# Patient Record
Sex: Female | Born: 1975 | Race: Black or African American | Hispanic: No | Marital: Single | Smoking: Former smoker
Health system: Southern US, Community
[De-identification: ages and names within clinical notes are randomized; demographics above are authoritative.]

## PROBLEM LIST (undated history)

## (undated) HISTORY — PX: CARPAL TUNNEL RELEASE: SHX101

---

## 2019-08-14 ENCOUNTER — Other Ambulatory Visit: Payer: Self-pay

## 2019-08-14 ENCOUNTER — Encounter (HOSPITAL_COMMUNITY): Payer: Self-pay | Admitting: *Deleted

## 2019-08-14 ENCOUNTER — Ambulatory Visit (HOSPITAL_COMMUNITY)
Admission: EM | Admit: 2019-08-14 | Discharge: 2019-08-14 | Disposition: A | Discharge status: Home / Self Care | Payer: Self-pay | Attending: Emergency Medicine | Admitting: Emergency Medicine

## 2019-08-14 DIAGNOSIS — Z3202 Encounter for pregnancy test, result negative: Secondary | ICD-10-CM

## 2019-08-14 DIAGNOSIS — M436 Torticollis: Secondary | ICD-10-CM

## 2019-08-14 LAB — POCT PREGNANCY, URINE: Preg Test, Ur: NEGATIVE

## 2019-08-14 MED ORDER — KETOROLAC TROMETHAMINE 30 MG/ML IJ SOLN
30.0000 mg | Freq: Once | INTRAMUSCULAR | Status: AC
  Administered 2019-08-14: 30 mg via INTRAMUSCULAR

## 2019-08-14 MED ORDER — ACETAMINOPHEN 325 MG PO TABS
ORAL_TABLET | ORAL | Status: AC
  Filled 2019-08-14: qty 3

## 2019-08-14 MED ORDER — TIZANIDINE HCL 4 MG PO TABS: 4.0000 mg | ORAL_TABLET | Freq: Three times a day (TID) | ORAL | 0 refills | Status: AC | PRN

## 2019-08-14 MED ORDER — KETOROLAC TROMETHAMINE 30 MG/ML IJ SOLN
INTRAMUSCULAR | Status: AC
  Filled 2019-08-14: qty 1

## 2019-08-14 MED ORDER — ACETAMINOPHEN 325 MG PO TABS
975.0000 mg | ORAL_TABLET | Freq: Once | ORAL | Status: AC
  Administered 2019-08-14: 15:00:00 975 mg via ORAL

## 2019-08-14 MED ORDER — DIAZEPAM 5 MG PO TABS: 5.0000 mg | ORAL_TABLET | Freq: Three times a day (TID) | ORAL | 0 refills | Status: DC | PRN

## 2019-08-14 MED ORDER — IBUPROFEN 600 MG PO TABS: 600.0000 mg | ORAL_TABLET | Freq: Four times a day (QID) | ORAL | 0 refills | Status: AC | PRN

## 2019-08-14 NOTE — ED Provider Notes (Signed)
HPI  SUBJECTIVE:  Rhonda Randolph is a 43 y.o. female who presents with 6 days of constant, sharp, stabbing left-sided neck pain after sleeping on it wrong.  She denies fall, trauma, fever, sore throat, voice changes, sensation of airway swelling/effusion.   States that her left arm is okay.  She reports limitation of motion of her neck secondary to pain.  No numbness or tingling in her left arm.  She tried Excedrin, ibuprofen 800 mg, icy patches without improvement in her symptoms.  Symptoms worse with turning her head to the left and bending forward.  Past medical history negative for neck injury, diabetes, hypertension.  LMP: 1 year ago.  PMD: None.  History reviewed. No pertinent past medical history.  Past Surgical History:  Procedure Laterality Date  . CARPAL TUNNEL RELEASE    . CESAREAN SECTION      Family History  Problem Relation Age of Onset  . Diabetes Mother   . Hypertension Mother   . Diabetes Father     Social History   Tobacco Use  . Smoking status: Former Research scientist (life sciences)  . Smokeless tobacco: Never Used  Substance Use Topics  . Alcohol use: Yes    Comment: occasionally  . Drug use: Never     Current Facility-Administered Medications:  .  acetaminophen (TYLENOL) tablet 975 mg, 975 mg, Oral, Once, Melynda Ripple, MD .  ketorolac (TORADOL) 30 MG/ML injection 30 mg, 30 mg, Intramuscular, Once, Melynda Ripple, MD  Current Outpatient Medications:  .  diazepam (VALIUM) 5 MG tablet, Take 1 tablet (5 mg total) by mouth every 8 (eight) hours as needed for muscle spasms., Disp: 12 tablet, Rfl: 0 .  ibuprofen (ADVIL) 600 MG tablet, Take 1 tablet (600 mg total) by mouth every 6 (six) hours as needed., Disp: 30 tablet, Rfl: 0 .  tiZANidine (ZANAFLEX) 4 MG tablet, Take 1 tablet (4 mg total) by mouth every 8 (eight) hours as needed for muscle spasms., Disp: 30 tablet, Rfl: 0  No Known Allergies   ROS  As noted in HPI.   Physical Exam  BP 120/83   Pulse 77   Temp 98 F  (36.7 C) (Other (Comment))   Resp 18   SpO2 99%   Constitutional: Well developed, well nourished, no acute distress Eyes:  EOMI, conjunctiva normal bilaterally HENT: Normocephalic, atraumatic,mucus membranes moist. Normal voice. Respiratory: Normal inspiratory effort Cardiovascular: Normal rate, regular rhythm, no murmurs rubs or gallops.  No carotid bruit GI: nondistended skin: No rash, skin intact Musculoskeletal: No C-spine tenderness.  Patient unable to fully extend neck or turn head to the left.  Able to flex neck, turn head to the right.  Positive left-sided trapezial tenderness, muscle spasm.  Positive tenderness along the splenis capitis.  No appreciable swelling in between the clavicle and trapezius..  Sensation down bilateral arms grossly intact and equal.  Grip strength 5/5 and equal bilaterally.  RP 2+. Neurologic: Alert & oriented x 3, no focal neuro deficits Psychiatric: Speech and behavior appropriate   ED Course   Medications  acetaminophen (TYLENOL) tablet 975 mg (has no administration in time range)  ketorolac (TORADOL) 30 MG/ML injection 30 mg (has no administration in time range)    Orders Placed This Encounter  Procedures  . POC urine pregnancy    Standing Status:   Standing    Number of Occurrences:   1  . Pregnancy, urine POC    Standing Status:   Standing    Number of Occurrences:   1  Results for orders placed or performed during the hospital encounter of 08/14/19 (from the past 24 hour(s))  Pregnancy, urine POC     Status: None   Collection Time: 08/14/19  3:03 PM  Result Value Ref Range   Preg Test, Ur NEGATIVE NEGATIVE   No results found.  ED Clinical Impression  1. Torticollis      ED Assessment/Plan  [place] narcotic database reviewed.  Feel that it is appropriate to proceed with a controlled substance prescription.  Patient with a torticollis.  She denies sore throat, sensation of her airway swelling shut, so doubt deep space infection.   She has exquisite muscular tenderness.  She denies trauma, fall to the neck other than sleeping on it wrong.  Deferring imaging today.  No carotid bruit, has no focal neurologic deficits, doubt carotid dissection.  Will send home with Tylenol/ibuprofen, Zanaflex, and if that does not work short course of Valium.  Will write a work note for tomorrow.  Providing primary care list for her to follow-up with.  She is to go to the ER if she gets worse.  30 mg of Toradol and 975 mg of Tylenol here.  Discussed  MDM, treatment plan, and plan for follow-up with patient. Discussed sn/sx that should prompt return to the ED. patient agrees with plan.   Meds ordered this encounter  Medications  . acetaminophen (TYLENOL) tablet 975 mg  . ketorolac (TORADOL) 30 MG/ML injection 30 mg  . tiZANidine (ZANAFLEX) 4 MG tablet    Sig: Take 1 tablet (4 mg total) by mouth every 8 (eight) hours as needed for muscle spasms.    Dispense:  30 tablet    Refill:  0  . diazepam (VALIUM) 5 MG tablet    Sig: Take 1 tablet (5 mg total) by mouth every 8 (eight) hours as needed for muscle spasms.    Dispense:  12 tablet    Refill:  0  . ibuprofen (ADVIL) 600 MG tablet    Sig: Take 1 tablet (600 mg total) by mouth every 6 (six) hours as needed.    Dispense:  30 tablet    Refill:  0    *This clinic note was created using Scientist, clinical (histocompatibility and immunogenetics). Therefore, there may be occasional mistakes despite careful proofreading.   ?   Domenick Gong, MD 08/15/19 [phone]

## 2019-08-14 NOTE — ED Triage Notes (Signed)
Denies injury.  C/O left lateral neck pain x 6 days.  C/O inability to move head without severe pain. States unable to sleep.

## 2019-08-14 NOTE — Discharge Instructions (Signed)
Take 600 mg ibuprofen combined with 1 g of Tylenol 3-4 times a day as needed for pain.  Try the Zanaflex first and if this does not work, then take the Valium.  Do not drink while taking the Valium.  You may continue the icy patches.

## 2020-03-14 ENCOUNTER — Ambulatory Visit (INDEPENDENT_AMBULATORY_CARE_PROVIDER_SITE_OTHER): Payer: Medicaid Other

## 2020-03-14 ENCOUNTER — Ambulatory Visit (HOSPITAL_COMMUNITY)
Admission: EM | Admit: 2020-03-14 | Discharge: 2020-03-14 | Disposition: A | Discharge status: Home / Self Care | Payer: Medicaid Other | Attending: Family Medicine | Admitting: Family Medicine

## 2020-03-14 ENCOUNTER — Other Ambulatory Visit: Payer: Self-pay

## 2020-03-14 ENCOUNTER — Encounter (HOSPITAL_COMMUNITY): Payer: Self-pay

## 2020-03-14 DIAGNOSIS — R05 Cough: Secondary | ICD-10-CM

## 2020-03-14 DIAGNOSIS — K529 Noninfective gastroenteritis and colitis, unspecified: Secondary | ICD-10-CM

## 2020-03-14 DIAGNOSIS — U071 COVID-19: Secondary | ICD-10-CM | POA: Insufficient documentation

## 2020-03-14 DIAGNOSIS — Z8249 Family history of ischemic heart disease and other diseases of the circulatory system: Secondary | ICD-10-CM | POA: Insufficient documentation

## 2020-03-14 DIAGNOSIS — Z87891 Personal history of nicotine dependence: Secondary | ICD-10-CM | POA: Insufficient documentation

## 2020-03-14 DIAGNOSIS — R062 Wheezing: Secondary | ICD-10-CM

## 2020-03-14 DIAGNOSIS — R0602 Shortness of breath: Secondary | ICD-10-CM | POA: Insufficient documentation

## 2020-03-14 DIAGNOSIS — R059 Cough, unspecified: Secondary | ICD-10-CM

## 2020-03-14 DIAGNOSIS — Z833 Family history of diabetes mellitus: Secondary | ICD-10-CM | POA: Diagnosis not present

## 2020-03-14 DIAGNOSIS — R112 Nausea with vomiting, unspecified: Secondary | ICD-10-CM | POA: Diagnosis present

## 2020-03-14 DIAGNOSIS — Z3202 Encounter for pregnancy test, result negative: Secondary | ICD-10-CM

## 2020-03-14 LAB — POC URINE PREG, ED: Preg Test, Ur: NEGATIVE

## 2020-03-14 MED ORDER — ONDANSETRON 4 MG PO TBDP: 4.0000 mg | ORAL_TABLET | Freq: Three times a day (TID) | ORAL | 0 refills | Status: DC | PRN

## 2020-03-14 MED ORDER — PREDNISONE 10 MG (21) PO TBPK: ORAL_TABLET | Freq: Every day | ORAL | 0 refills | Status: DC

## 2020-03-14 NOTE — ED Triage Notes (Signed)
Pt is here with NVD, nasal congestion, loss of smell 7 days ago. Pt has not taken any meds to relieve discomfort.

## 2020-03-14 NOTE — Discharge Instructions (Addendum)
Please do your best to ensure adequate fluid intake in order to avoid dehydration. If you find that you are unable to tolerate drinking fluids regularly please proceed to the Emergency Department for evaluation.  Also, you should return to the hospital if you experience persistent fevers for greater than 1-2 more days, increasing abdominal pain that persists despite medications, persistent diarrhea, dizziness, syncope (fainting), or for any other concerns you may deem worrisome.  You have been tested for COVID-19 today. If your test returns positive, you will receive a phone call from [facility] regarding your results. Negative test results are not called. Both positive and negative results area always visible on MyChart. If you do not have a MyChart account, sign up instructions are provided in your discharge papers. Please do not hesitate to contact us should you have questions or concerns.   You have been tested for COVID-19 today. If your test returns positive, you will receive a phone call from [facility] regarding your results. Negative test results are not called. Both positive and negative results area always visible on MyChart. If you do not have a MyChart account, sign up instructions are provided in your discharge papers. Please do not hesitate to contact us should you have questions or concerns.

## 2020-03-14 NOTE — ED Provider Notes (Addendum)
[facility]   [id number] 03/14/20 Arrival Time: 1819  ASSESSMENT & PLAN:  1. Gastroenteritis   2. SOB (shortness of breath)     Higher suspicion for COVID. COVID testing sent. Likely viral URI with gastroenteritis. Is tolerating PO fluids.  Meds ordered this encounter  Medications  . ondansetron (ZOFRAN-ODT) 4 MG disintegrating tablet    Sig: Take 1 tablet (4 mg total) by mouth every 8 (eight) hours as needed for nausea or vomiting.    Dispense:  15 tablet    Refill:  0  . predniSONE (STERAPRED UNI-PAK 21 TAB) 10 MG (21) TBPK tablet    Sig: Take by mouth daily. Take as directed.    Dispense:  21 tablet    Refill:  0    I have personally viewed the imaging studies ordered this visit. Normal CXR.  Discussed typical duration of symptoms for suspected viral GI illness. Will do her best to ensure adequate fluid intake in order to avoid dehydration. Will proceed to the Emergency Department for evaluation if unable to tolerate PO fluids regularly.  Otherwise she will f/u with her PCP or here if not showing improvement over the next 48-72 hours.  Reviewed expectations re: course of current medical issues. Questions answered. Outlined signs and symptoms indicating need for more acute intervention. Patient verbalized understanding. After Visit Summary given.   SUBJECTIVE: History from: patient.  Rhonda Randolph is a 44 y.o. female who presents with complaint of non-bilious, non-bloody intermittent n/v with non-bloody diarrhea. Onset approx one week ago. Abdominal discomfort: mild and cramping at times. Symptoms are somewhat better since beginning but have not fully resolved. Aggravating factors: odors and PO intake. Alleviating factors: none identified. Associated symptoms: fatigue. She denies arthralgias, chills, headache, myalgias and sweats. Appetite: decreased. PO intake: decreased. Ambulatory without assistance. Urinary symptoms: none. Sick contacts: none. Recent  travel or camping: none. OTC treatment: none reported.  She also reports feeling SOB at times over the past couple of days. With a non-productive cough. H/O asthma. Non-smoker. No specific chest pain.   Past Surgical History:  Procedure Laterality Date  . CARPAL TUNNEL RELEASE    . CESAREAN SECTION       OBJECTIVE:  Vitals:   03/14/20 1853 03/14/20 1854  BP: 130/79   Pulse: 99   Resp: 19   Temp: 99.6 F (37.6 C)   TempSrc: Oral   SpO2: 100%   Weight:  122.4 kg    General appearance: alert; no distress Oropharynx: somewhat dry Lungs: clear to auscultation bilaterally; unlabored Heart: regular  Abdomen: soft; non-distended; no significant abdominal tenderness; reports "cramping" feeling; bowel sounds present; no masses or organomegaly; no guarding or rebound tenderness Back: no CVA tenderness Extremities: no edema; symmetrical with no gross deformities Skin: warm; dry Neurologic: normal gait Psychological: alert and cooperative; normal mood and affect  Labs:  Labs Reviewed  SARS CORONAVIRUS 2 (TAT 6-24 HRS)    Imaging: DG Chest 2 View  Result Date: 03/14/2020 CLINICAL DATA:  Shortness of breath EXAM: CHEST - 2 VIEW COMPARISON:  None. FINDINGS: The heart size and mediastinal contours are within normal limits. Both lungs are clear. The visualized skeletal structures are unremarkable. IMPRESSION: No active cardiopulmonary disease. Electronically Signed   By: Jasmine Pang M.D.   On: 03/14/2020 19:59     No Known Allergies  History reviewed. No pertinent past medical history.   Social History   Socioeconomic History  . Marital status: Single    Spouse name: Not on file  . Number of children: Not on file  . Years of education: Not on file  . Highest education level: Not on file  Occupational History  . Not on file  Tobacco Use  . Smoking status: Former Research scientist (life sciences)  . Smokeless tobacco: Never Used  Substance and Sexual  Activity  . Alcohol use: Yes    Comment: occasionally  . Drug use: Never  . Sexual activity: Yes    Birth control/protection: None  Other Topics Concern  . Not on file  Social History Narrative  . Not on file   Social Determinants of Health   Financial Resource Strain:   . Difficulty of Paying Living Expenses:   Food Insecurity:   . Worried About Charity fundraiser in the Last Year:   . Arboriculturist in the Last Year:   Transportation Needs:   . Film/video editor (Medical):   Marland Kitchen Lack of Transportation (Non-Medical):   Physical Activity:   . Days of Exercise per Week:   . Minutes of Exercise per Session:   Stress:   . Feeling of Stress :   Social Connections:   . Frequency of Communication with Friends and Family:   . Frequency of Social Gatherings with Friends and Family:   . Attends Religious Services:   . Active Member of Clubs or Organizations:   . Attends Archivist Meetings:   Marland Kitchen Marital Status:   Intimate Partner Violence:   . Fear of Current or Ex-Partner:   . Emotionally Abused:   Marland Kitchen Physically Abused:   . Sexually Abused:    Family History  Problem Relation Age of Onset  . Diabetes Mother   . Hypertension Mother   . Diabetes Father      Vanessa Kick, MD 03/15/20 [place], MD 03/15/20 1028

## 2020-03-15 ENCOUNTER — Telehealth: Payer: Self-pay

## 2020-03-15 LAB — SARS CORONAVIRUS 2 (TAT 6-24 HRS): SARS Coronavirus 2: POSITIVE — AB

## 2020-03-15 NOTE — Telephone Encounter (Signed)
Pt c/b re positive COVID.  Reviewed quarantine guidelines per CDC recommendations.  Pt needs result picked up - does not have access to MyChart.  Auth'ed Richard Miu to pick up printed result.  Pt advised to seek medical attn at ED for worsening s/s including SOB, cough, etc.  Pt verbalizes understanding and has no further questions.

## 2020-09-24 ENCOUNTER — Ambulatory Visit (HOSPITAL_COMMUNITY)
Admission: EM | Admit: 2020-09-24 | Discharge: 2020-09-24 | Disposition: A | Discharge status: Home / Self Care | Payer: Medicaid Other | Attending: Nurse Practitioner | Admitting: Nurse Practitioner

## 2020-09-24 ENCOUNTER — Other Ambulatory Visit: Payer: Self-pay

## 2020-09-24 ENCOUNTER — Encounter (HOSPITAL_COMMUNITY): Payer: Self-pay | Admitting: Emergency Medicine

## 2020-09-24 DIAGNOSIS — T148XXA Other injury of unspecified body region, initial encounter: Secondary | ICD-10-CM

## 2020-09-24 DIAGNOSIS — R109 Unspecified abdominal pain: Secondary | ICD-10-CM

## 2020-09-24 LAB — POCT URINALYSIS DIPSTICK, ED / UC
Bilirubin Urine: NEGATIVE
Glucose, UA: NEGATIVE mg/dL
Hgb urine dipstick: NEGATIVE
Ketones, ur: NEGATIVE mg/dL
Leukocytes,Ua: NEGATIVE
Nitrite: NEGATIVE
Protein, ur: NEGATIVE mg/dL
Specific Gravity, Urine: >=1.03 (ref 1.005–1.030)
Urobilinogen, UA: 0.2 mg/dL (ref 0.0–1.0)
pH: 5.5 (ref 5.0–8.0)

## 2020-09-24 LAB — POC URINE PREG, ED: Preg Test, Ur: NEGATIVE

## 2020-09-24 MED ORDER — KETOROLAC TROMETHAMINE 60 MG/2ML IM SOLN
INTRAMUSCULAR | Status: AC
  Filled 2020-09-24: qty 2

## 2020-09-24 MED ORDER — KETOROLAC TROMETHAMINE 60 MG/2ML IM SOLN
60.0000 mg | Freq: Once | INTRAMUSCULAR | Status: AC
  Administered 2020-09-24: 60 mg via INTRAMUSCULAR

## 2020-09-24 MED ORDER — KETOROLAC TROMETHAMINE 10 MG PO TABS: 10.0000 mg | ORAL_TABLET | Freq: Four times a day (QID) | ORAL | 0 refills | Status: AC | PRN

## 2020-09-24 MED ORDER — PREDNISONE 20 MG PO TABS: 40.0000 mg | ORAL_TABLET | Freq: Every day | ORAL | 0 refills | Status: AC

## 2020-09-24 NOTE — Discharge Instructions (Addendum)
Stop the Ibuprofen  Start Toradol as prescribed for pain  Take the steroids as prescribed for the next 5 days Continue your muscle relaxer's as needed  Alternate between ice and heat to the areas that are hurting  Avoid any heavy lifting or pulling until your symptoms improve

## 2020-09-24 NOTE — ED Triage Notes (Signed)
Pain for 5 days.  Patient touches left flank as site of pain.  Has shooting pain into left leg to the mid upper leg.  Denies urinary symptoms.    Patient thinks she slept wrong.  No known injury.  Pain is getting sharper/worse since onset.

## 2020-09-24 NOTE — ED Provider Notes (Signed)
[facility]    CSN: [id number] Arrival date & time: 09/24/20  7517      History   Chief Complaint Chief Complaint  Patient presents with   Flank Pain    HPI Rhonda Randolph is a 44 y.o. female.   Subjective:  Rhonda Randolph is a 44 y.o. female who complains of left flank pain for 5 days. Patient also complains of urinary frequency. Patient denies back pain, fever, vaginal discharge or dysuria.  No bowel or bladder incontinence. No lower extremity paraesthesias. No heavy lifting or pulling. Patient does not have a history of recurrent UTI.  Patient does not have a history of pyelonephritis. Patient initially thought her pain was due to a pulled muscle. She has tried ibuprofen, muscle relaxer and hot baths without any relief in symptoms.   The following portions of the patient's history were reviewed and updated as appropriate: allergies, current medications, past family history, past medical history, past social history, past surgical history and problem list.        History reviewed. No pertinent past medical history.  There are no problems to display for this patient.   Past Surgical History:  Procedure Laterality Date   CARPAL TUNNEL RELEASE     CESAREAN SECTION      OB History   No obstetric history on file.      Home Medications    Prior to Admission medications   Medication Sig Start Date End Date Taking? Authorizing Provider  ibuprofen (ADVIL) 600 MG tablet Take 1 tablet (600 mg total) by mouth every 6 (six) hours as needed. 08/14/19  Yes Domenick Gong, MD  tiZANidine (ZANAFLEX) 4 MG tablet Take 1 tablet (4 mg total) by mouth every 8 (eight) hours as needed for muscle spasms. 08/14/19  Yes Domenick Gong, MD  ketorolac (TORADOL) 10 MG tablet Take 1 tablet (10 mg total) by mouth every 6 (six) hours as needed. 09/24/20   Lurline Idol, FNP  predniSONE (DELTASONE) 20 MG tablet Take 2 tablets (40 mg total) by mouth daily for 5 days. 09/24/20  09/29/20  Lurline Idol, FNP    Family History Family History  Problem Relation Age of Onset   Diabetes Mother    Hypertension Mother    Diabetes Father     Social History Social History   Tobacco Use   Smoking status: Former Smoker   Smokeless tobacco: Never Used  Building services engineer Use: Never used  Substance Use Topics   Alcohol use: Yes    Comment: occasionally   Drug use: Never     Allergies   Patient has no known allergies.   Review of Systems Review of Systems  Constitutional: Negative for fever.  Gastrointestinal: Negative for abdominal pain, diarrhea, nausea and vomiting.  Genitourinary: Positive for flank pain and frequency. Negative for dysuria, pelvic pain and vaginal discharge.  Musculoskeletal: Negative for back pain.  Neurological: Negative.   All other systems reviewed and are negative.    Physical Exam Triage Vital Signs ED Triage Vitals [09/24/20 0920]  Enc Vitals Group     BP (!) 146/94     Pulse Rate 85     Resp 20     Temp 98.4 F (36.9 C)     Temp Source Oral     SpO2 98 %     Weight      Height      Head Circumference      Peak Flow      Pain  Score      Pain Loc      Pain Edu?      Excl. in GC?    No data found.  Updated Vital Signs BP (!) 146/94 (BP Location: Right Arm) Comment: large cuff   Pulse 85    Temp 98.4 F (36.9 C) (Oral)    Resp 20    LMP 09/03/2020    SpO2 98%   Visual Acuity Right Eye Distance:   Left Eye Distance:   Bilateral Distance:    Right Eye Near:   Left Eye Near:    Bilateral Near:     Physical Exam Vitals reviewed.  Constitutional:      General: She is not in acute distress.    Appearance: Normal appearance. She is not ill-appearing or toxic-appearing.  HENT:     Head: Normocephalic.  Cardiovascular:     Rate and Rhythm: Normal rate and regular rhythm.  Pulmonary:     Effort: Pulmonary effort is normal.     Breath sounds: Normal breath sounds.  Abdominal:     General:  Bowel sounds are normal. There is no distension.     Palpations: Abdomen is soft.     Tenderness: There is no abdominal tenderness. There is no left CVA tenderness.  Musculoskeletal:        General: No tenderness. Normal range of motion.     Cervical back: Normal range of motion and neck supple.  Skin:    General: Skin is warm and dry.  Neurological:     General: No focal deficit present.     Mental Status: She is alert and oriented to person, place, and time.  Psychiatric:        Mood and Affect: Mood normal.        Behavior: Behavior normal.      UC Treatments / Results  Labs (all labs ordered are listed, but only abnormal results are displayed) Labs Reviewed  POCT URINALYSIS DIPSTICK, ED / UC  POC URINE PREG, ED    EKG   Radiology No results found.  Procedures Procedures (including critical care time)  Medications Ordered in UC Medications  ketorolac (TORADOL) injection 60 mg (has no administration in time range)    Initial Impression / Assessment and Plan / UC Course  I have reviewed the triage vital signs and the nursing notes.  Pertinent labs & imaging results that were available during my care of the patient were reviewed by me and considered in my medical decision making (see chart for details).    44 yo female presenting with left flank pain with urinary frequency for the past 5 days. No back pain, fever, vaginal discharge, dysuria, bowel/bladder incontinence or lower extremity paraesthesias. She denies any injury or trauma. Patient is uncomfortable but non-toxic appearing. Afebrile. VSS. UA unremarkable. Symptoms most likely due to a muscle strain. Toradol IM given in clinic.   Plan:  1. Medications: Toradol, prednisone, muscle relaxer's  2. Alternate between ice and heat to affected areas  3. Avoid heavy lifting and/or pulling until symptoms improve  4. Maintain adequate hydration 5. Follow up if symptoms not improving, and prn.  Today's evaluation  has revealed no signs of a dangerous process. Discussed diagnosis with patient and/or guardian. Patient and/or guardian aware of their diagnosis, possible red flag symptoms to watch out for and need for close follow up. Patient and/or guardian understands verbal and written discharge instructions. Patient and/or guardian comfortable with plan and disposition.  Patient and/or guardian  has a clear mental status at this time, good insight into illness (after discussion and teaching) and has clear judgment to make decisions regarding their care  This care was provided during an unprecedented National Emergency due to the Novel Coronavirus (COVID-19) pandemic. COVID-19 infections and transmission risks place heavy strains on healthcare resources.  As this pandemic evolves, our facility, providers, and staff strive to respond fluidly, to remain operational, and to provide care relative to available resources and information. Outcomes are unpredictable and treatments are without well-defined guidelines. Further, the impact of COVID-19 on all aspects of urgent care, including the impact to patients seeking care for reasons other than COVID-19, is unavoidable during this national emergency. At this time of the global pandemic, management of patients has significantly changed, even for non-COVID positive patients given high local and regional COVID volumes at this time requiring high healthcare system and resource utilization. The standard of care for management of both COVID suspected and non-COVID suspected patients continues to change rapidly at the local, regional, national, and global levels. This patient was worked up and treated to the best available but ever changing evidence and resources available at this current time.   Documentation was completed with the aid of voice recognition software. Transcription may contain typographical errors.  Final Clinical Impressions(s) / UC Diagnoses   Final diagnoses:    Left flank pain  Muscle strain     Discharge Instructions      Stop the Ibuprofen   Start Toradol as prescribed for pain   Take the steroids as prescribed for the next 5 days  Continue your muscle relaxer's as needed   Alternate between ice and heat to the areas that are hurting   Avoid any heavy lifting or pulling until your symptoms improve     ED Prescriptions    Medication Sig Dispense Auth. Provider   predniSONE (DELTASONE) 20 MG tablet Take 2 tablets (40 mg total) by mouth daily for 5 days. 10 tablet Lurline Idol, FNP   ketorolac (TORADOL) 10 MG tablet Take 1 tablet (10 mg total) by mouth every 6 (six) hours as needed. 20 tablet Lurline Idol, FNP     PDMP not reviewed this encounter.   Lurline Idol, [place] 09/24/20 1022

## 2021-09-13 IMAGING — DX DG CHEST 2V
2 series · 2 of 2 positions shown · non-contrast
Comparison: None.

CLINICAL DATA: Shortness of breath

EXAM:
CHEST - 2 VIEW

[chest pa]
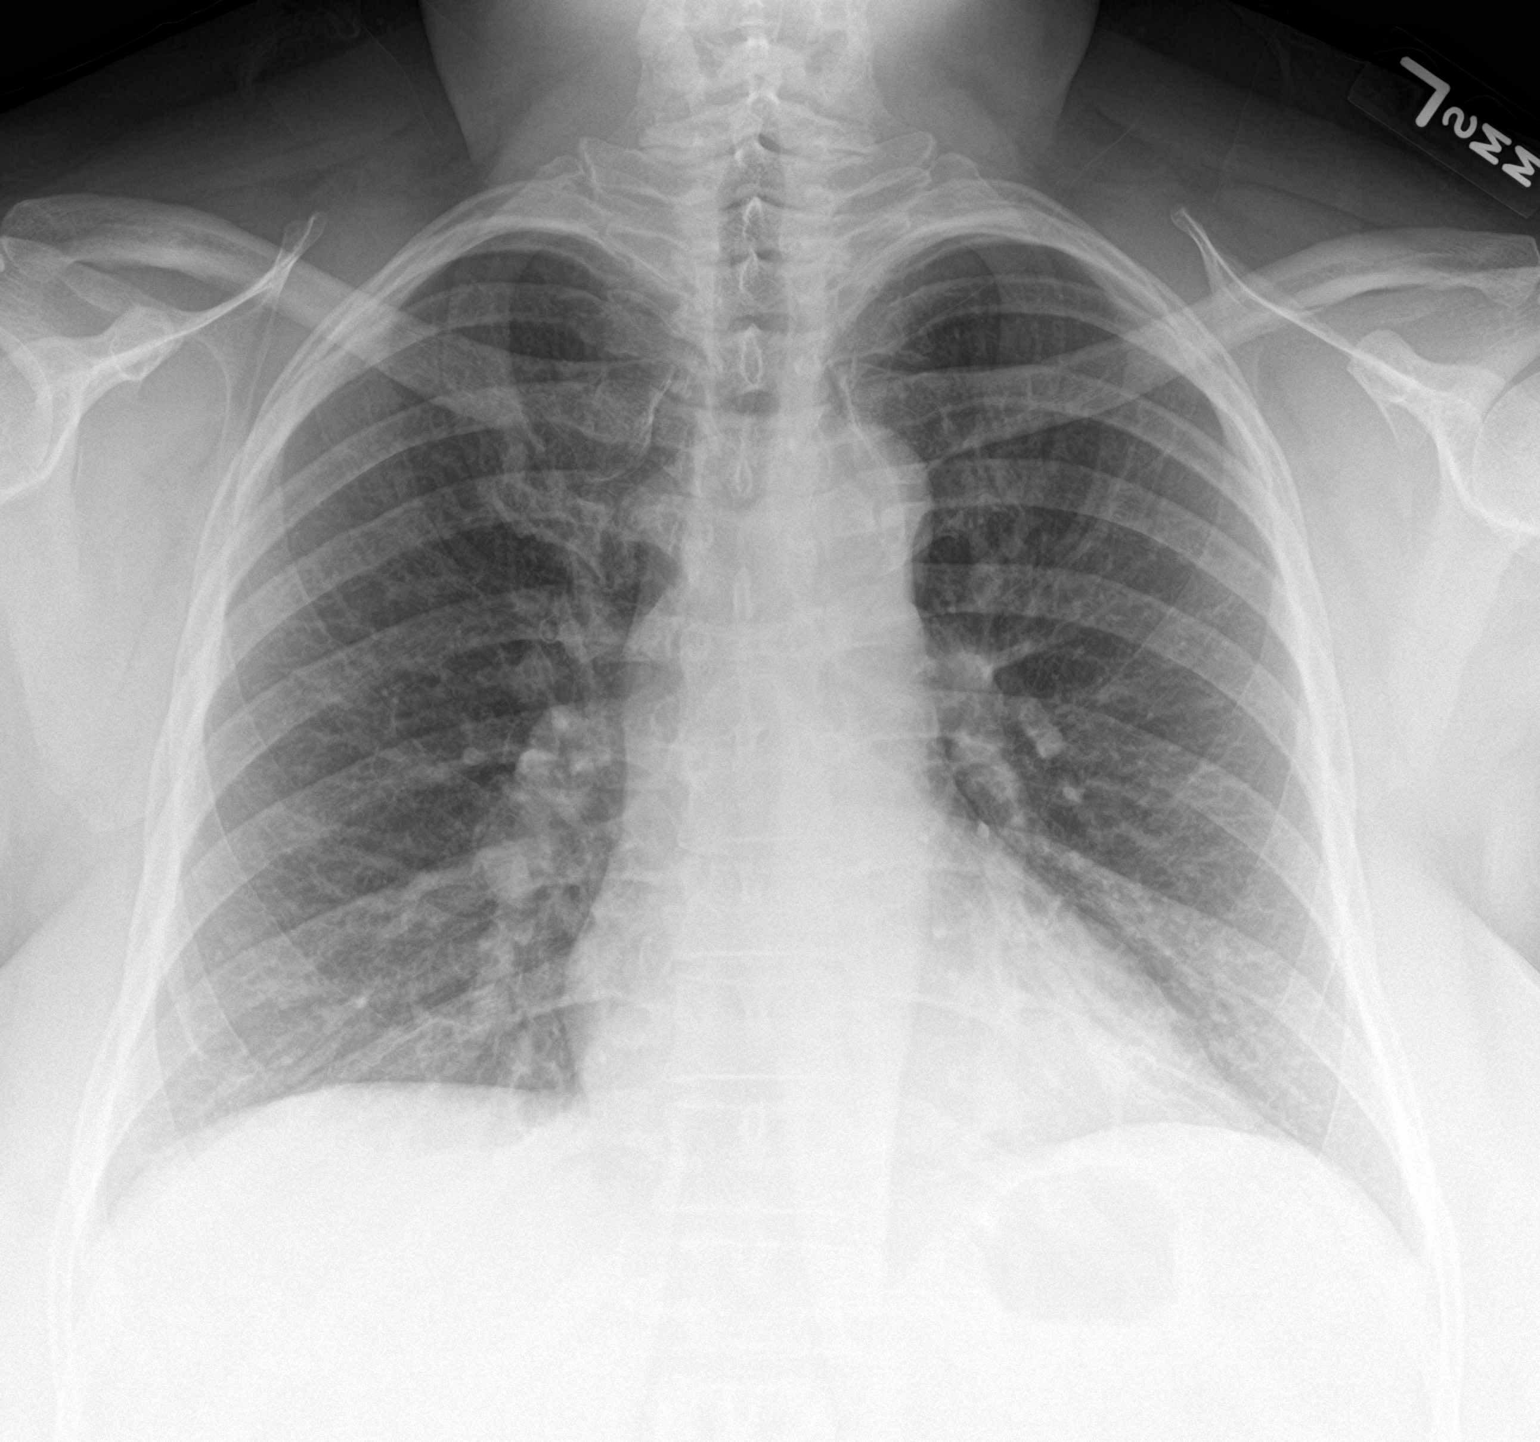

[chest lat]
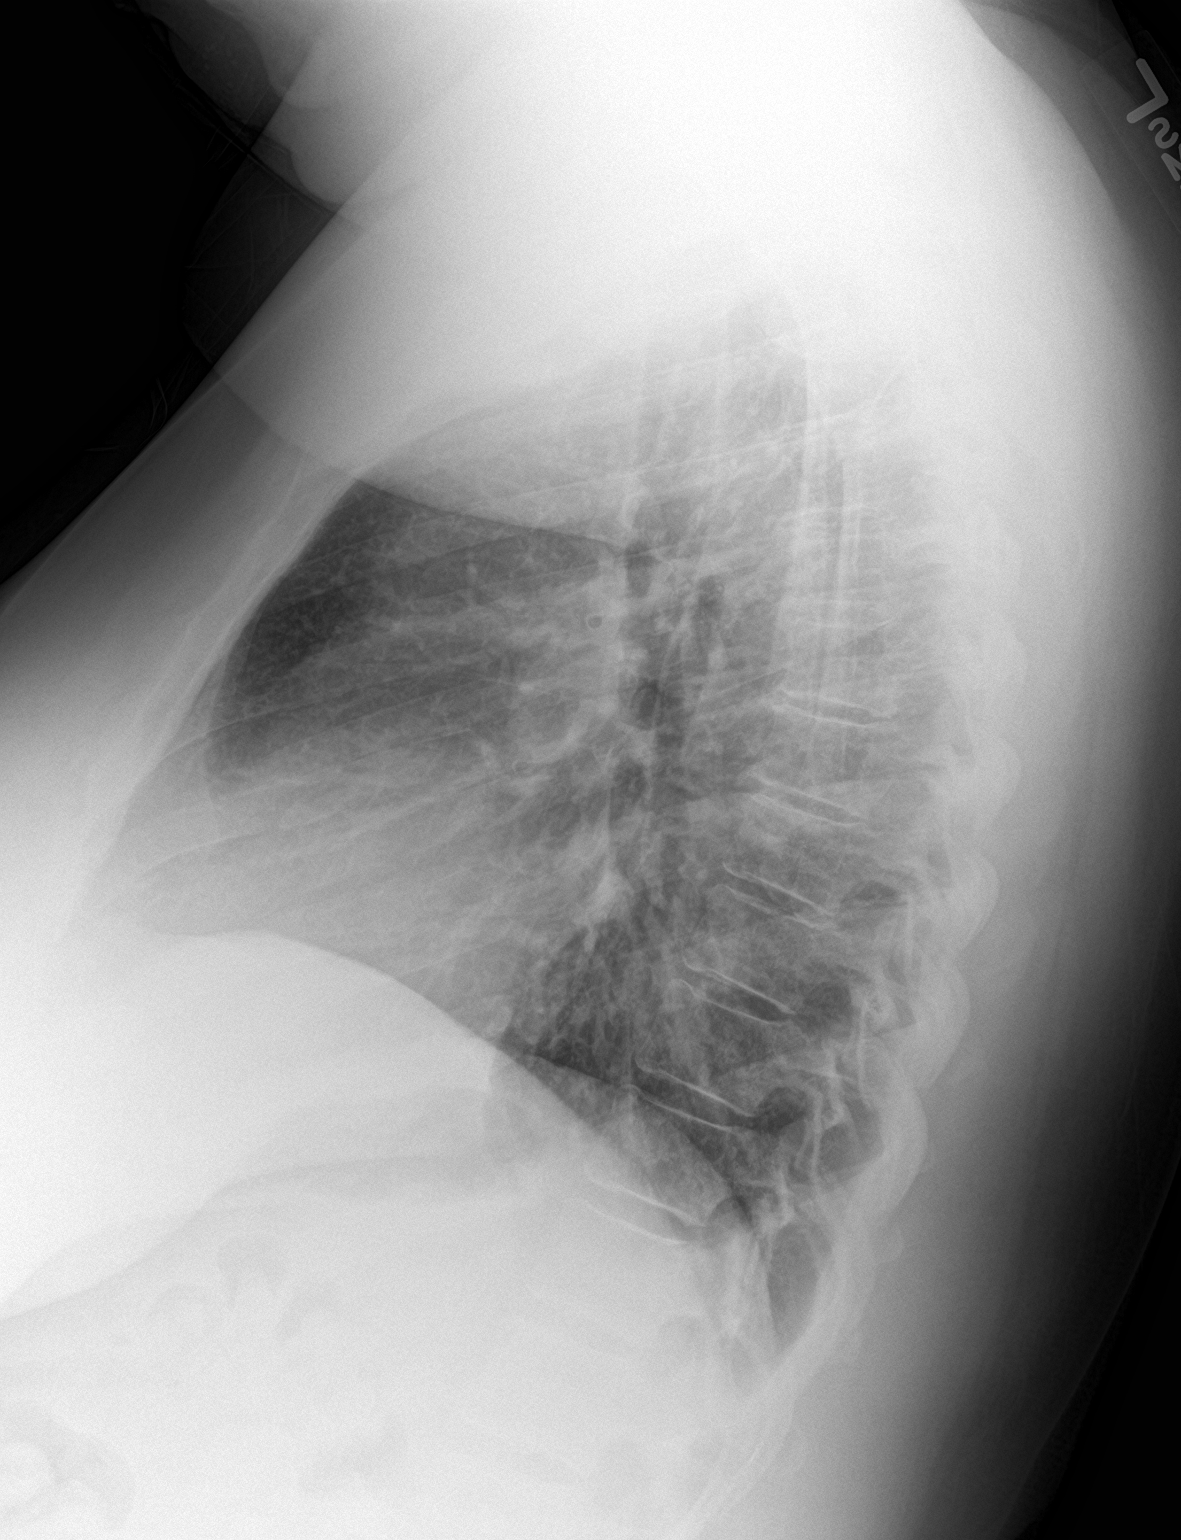

[2 of 2 positions shown; findings below may reference images not displayed]

FINDINGS: The heart size and mediastinal contours are within normal limits.
Both lungs are clear. The visualized skeletal structures are
unremarkable.
IMPRESSION: No active cardiopulmonary disease.
